# Patient Record
Sex: Male | Born: 1940 | Race: White | Hispanic: No | Marital: Married
Health system: Southern US, Community
[De-identification: ages and names within clinical notes are randomized; demographics above are authoritative.]

---

## 2007-04-22 ENCOUNTER — Inpatient Hospital Stay (HOSPITAL_COMMUNITY): Admission: RE | Admit: 2007-04-22 | Discharge: 2007-04-26 | Payer: Self-pay | Admitting: Orthopedic Surgery

## 2008-01-27 ENCOUNTER — Inpatient Hospital Stay (HOSPITAL_COMMUNITY): Admission: RE | Admit: 2008-01-27 | Discharge: 2008-01-30 | Payer: Self-pay | Admitting: Orthopedic Surgery

## 2010-05-01 IMAGING — CR DG KNEE 1-2V PORT*L*
2 series · 2 of 2 positions shown · non-contrast
Comparison: None

CLINICAL DATA: Postop left knee replacement

PORTABLE LEFT KNEE - 1-2 VIEW

[view not recorded (1 of 2)]
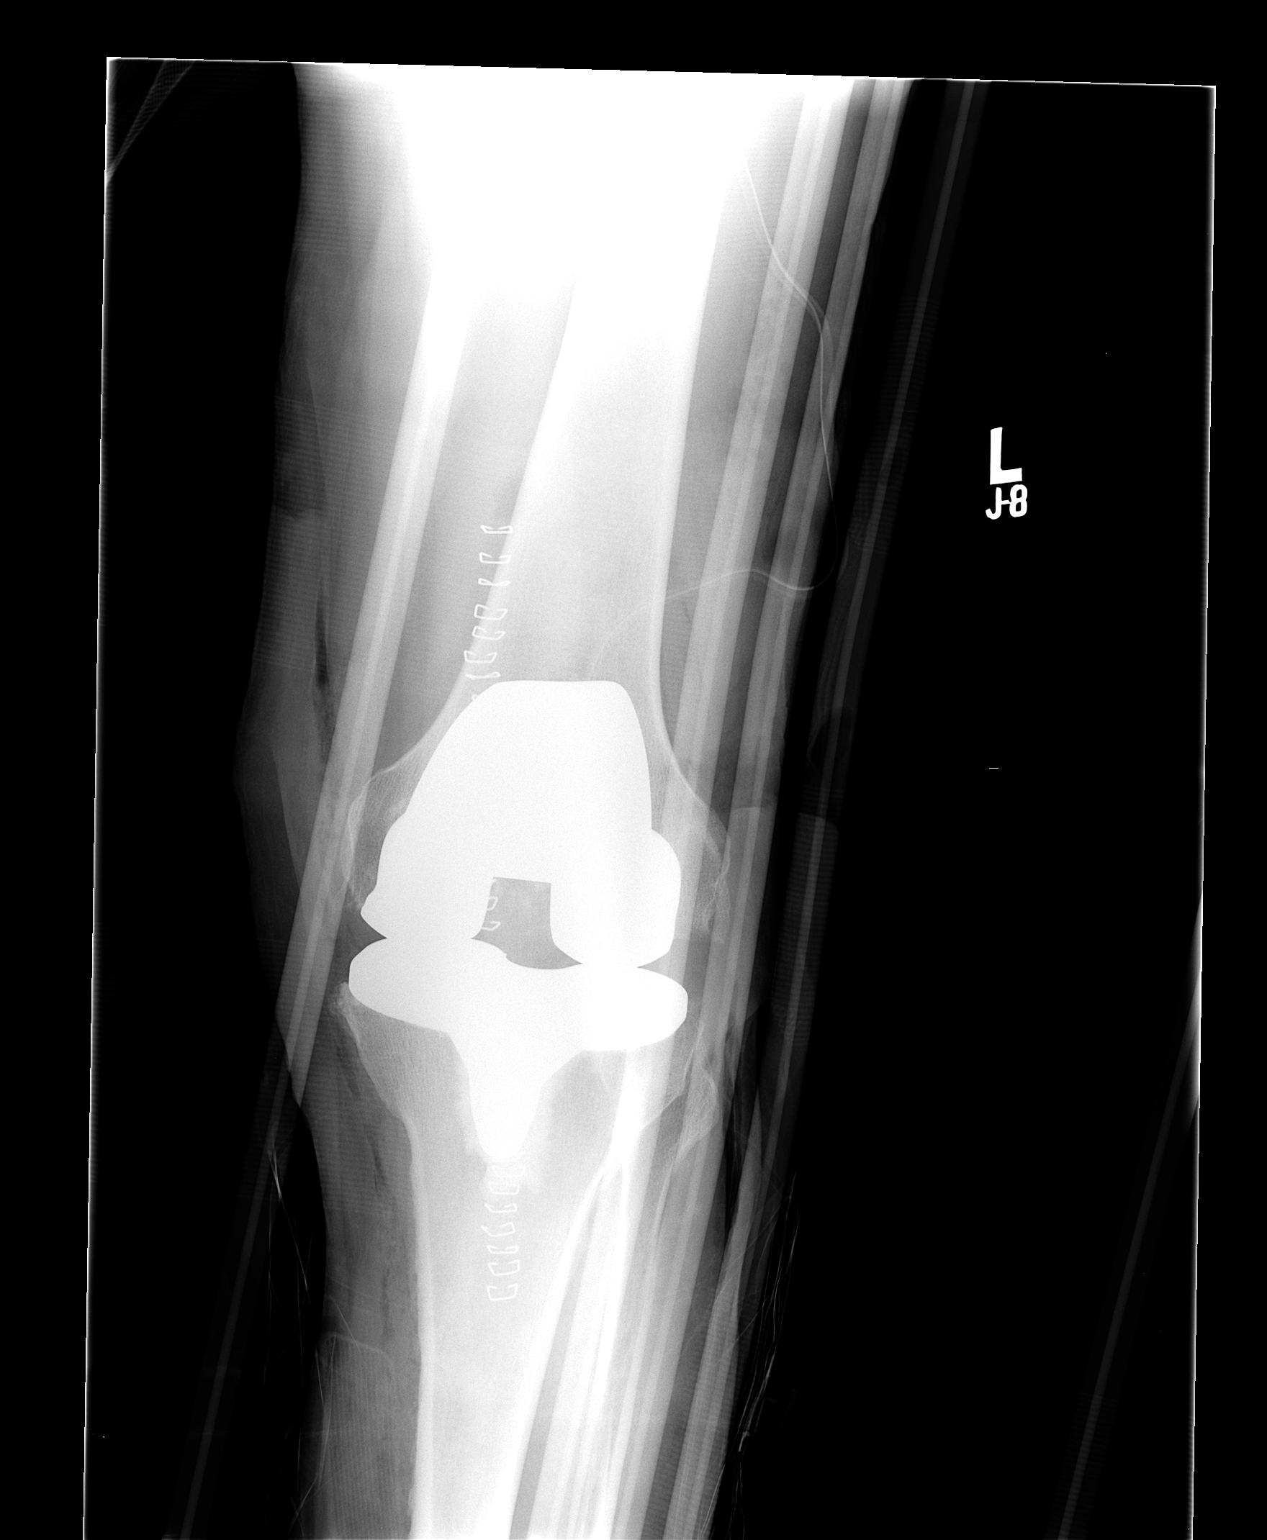

[view not recorded (2 of 2)]
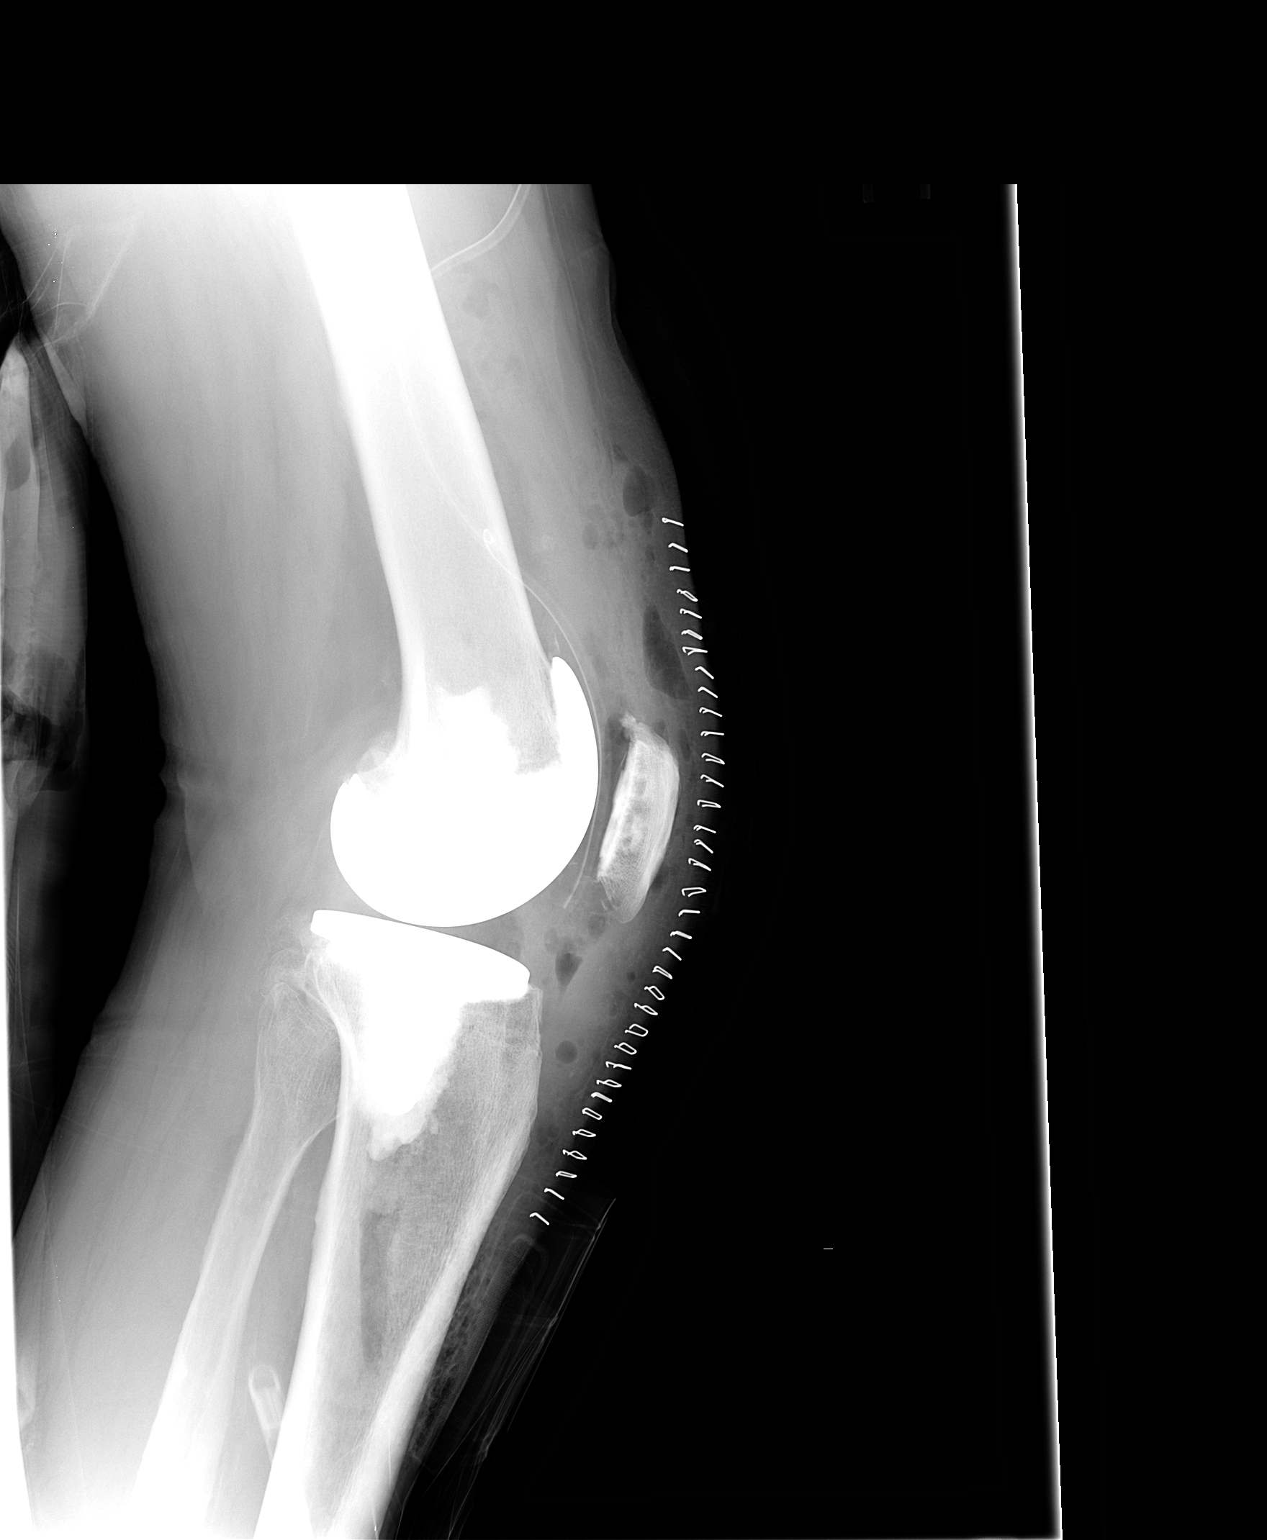

[2 of 2 positions shown; findings below may reference images not displayed]

FINDINGS: Postsurgical changes secondary left total knee
replacement noted.  The prosthesis is well positioned.  The
alignment of the knee is anatomic.  There is no perioperative
complication identified.  A surgical drain extends into the
suprapatellar recess.
IMPRESSION: Normal postoperative appearance left total knee replacement.

## 2010-12-11 NOTE — H&P (Signed)
Dylan Thomas, Dylan Thomas              ACCOUNT NO.:  [id number]   MEDICAL RECORD NO.:  [id number]        PATIENT TYPE:  LINP   LOCATION:                               FACILITY:  [facility]   PHYSICIAN:  Ronald A. Gioffre, M.D.DATE OF BIRTH:  04/30/1941   DATE OF ADMISSION:  01/27/2008  DATE OF DISCHARGE:                              HISTORY & PHYSICAL   CHIEF COMPLAINT:  Painful loss of range of motion of the left knee.   HISTORY OF PRESENT ILLNESS:  Dylan Thomas is a 70 year old gentleman who  has been a patient of Dr. Jeannetta Ellis for a while had bilateral knee pain.  The patient has already had a right total knee arthroplasty with good  results.  His left knee has now gotten to the point where he is having a  hard time ambulating, walking due to severe pain.  X-ray shows that he  is bone-on-bone medial compartment with tricompartment arthritic  changes.  The patient has elected to proceed with a total knee  arthroplasty.   ALLERGIES:  No known drug allergies.   CURRENT MEDICATIONS:  1. Arthrotek 75 mg 1 tablet twice a day.  2. Omeprazole 20 mg a day.  3. Lisinopril 20 mg a day.  4. Potassium citrate 10 mEq once a day.  5. Ativan 0.5 mg p.r.n.   PAST MEDICAL HISTORY:  1. Includes hypertension.  2. History of kidney stones.  3. Anxiety attacks.   REVIEW OF SYSTEMS:  Negative for any neurologic issues other than  occasional anxiety attack.  The Ativan works really well for him.  He  denies any pulmonary.  No cardiovascular.  No GI problems.  His reflux  is well-controlled with the omeprazole.  GU is occasional kidney stones,  last 2 years ago, and he has been placed on potassium citrate for this.  He denies any thyroid or hematologic issues.   PAST SURGICAL HISTORY:  Includes right total knee arthroplasty in  September 2008 without any complications.   FAMILY MEDICAL HISTORY:  Father is deceased from a heart attack at the  age of 26, mother is deceased at the age of 3 from  Crutchfield's  disease.  Brother is deceased at the age of 48 with colorectal cancer.   The patient is married.  He has smoked in the past.  He denies any  alcohol use.  He lives in a 1-storey house.   PHYSICAL EXAM:  VITAL SIGNS:  Height is 6 feet 2 inches, weight is 200  pounds, blood pressure is 120/60, pulse is 70 and regular, respirations  12.  The patient is afebrile.  GENERAL:  This is a healthy-appearing gentleman, conscious, alert, and  appropriate, appears to be a good historian.  He does have a slight bow-  leg deformity on his left side.  He walks with a slight limp.  HEENT:  Head was normocephalic.  Pupils are equal, round, and reactive.  Gross hearing is intact.  NECK:  Supple.  No palpable lymphadenopathy.  Good range of motion.  CHEST:  Lung sounds were clear and equal bilaterally.  No wheeze, rales,  or rhonchi.  HEART:  Regular rate and rhythm.  No murmurs, rubs, or gallops.  ABDOMEN:  Soft, nontender.  Bowel sounds present.  EXTREMITIES:  Upper extremities with good range of motion with  shoulders, elbows, and wrists.  LOWER EXTREMITIES:  He had excellent range of motion of both hips today  without any discomfort.  His right knee, he lacked about 5 degrees of  extension.  He can flex it back to 120 degrees.  He had no gross  instability.  Well-healed midline surgical incision.  The calf was soft.  His left knee, he lacked about 10 degrees of extension.  He was only  able to flex it back to 120 degrees.  He had quite a bit of discomfort  on the medial aspect.  He had no effusion, no erythema.  The calf was  soft.  He had good motion at the ankles.  PERIPHERAL VASCULAR:  Carotid pulses were 2+.  No bruits.  Radial pulses  were 2+.  Posterior tibial pulses were 2+.  He had no lower edema.  NEURO:  The patient was conscious, alert, and appropriate.  He had no  gross neurologic defects noted.  BREAST, RECTAL, AND GU:  Deferred at this time.   IMPRESSION:  1.  End-stage osteoarthritis, left knee.  2. Recent right total knee arthroplasty.  3. Hypertension.  4. Reflux disease.  5. Anxiety attacks.  6. History of kidney stones.   PLAN:  The patient has been evaluated and cleared by his primary care  physician for this upcoming surgical procedure.  The patient will  undergo all routine labs and tests prior to having a left total knee  arthroplasty by Dr. Darrelyn Hillock at [facility] on January 27, 2008.      Jamelle Rushing, P.A.    ______________________________  Georges Lynch Darrelyn Hillock, M.D.    RWK/MEDQ  D:  01/13/2008  T:  01/13/2008  Job:  [id number]   cc:   Windy Fast A. Darrelyn Hillock, M.D.  Fax: [phone]

## 2010-12-11 NOTE — Op Note (Signed)
NAMEHIKEEM, ANDERSSON              ACCOUNT NO.:  [id number]   MEDICAL RECORD NO.:  [id number]          PATIENT TYPE:  INP   LOCATION:  0007                         FACILITY:  [facility]   PHYSICIAN:  Georges Lynch. Gioffre, M.D.DATE OF BIRTH:  August 04, 1940   DATE OF PROCEDURE:  DATE OF DISCHARGE:                               OPERATIVE REPORT   A  70 year old gentleman with a severe degenerative arthritic left knee.  He had a previous right total knee done.  The operation was a lefttotal  knee arthroplasty utilizing the DePuy system.  I utilized vancomycin in  the cement.  The sizes used was a 41-mm patella, a size 5 left posterior  stabilized femoral component, a size 5 tibial tray with a size 4 tibial  insert 10-mm thickness rotating platform.   PROCEDURE:  Under general anesthesia, routine orthopedic prep and  draping of the left lower extremity was carried out.  He had 2 g of IV  Ancef.  The leg was exsanguinated with an Esmarch and the tourniquet was  elevated at 350 mmHg.  At this time, the knee was flexed and an incision  was made over the anterior aspect of the left knee.  Bleeders identified  and cauterized.  At this time, 2 flaps were created in the usual  fashion.  I then did a median parapatellar incision, reflected the  patellar patella laterally, flexed the knee and did a medial and lateral  meniscectomy and I excised the anterior posterior cruciate ligaments.  At that time, he had marked spur formation and I removed all the spurs  from the patella, tibia and the femur.  I then made my initial drill  hole into the intercondylar notch and distal femur.  Following that, the  #1 jig was inserted and removed 12-mm thickness off the distal femur.  Following that, I went ahead and did my sizing guide and measured the  femur to be a size 5.  The next jig was inserted and I carried out  anterior-posterior chamfer cuts for a size 5 femur.  This was the left  femur.  Following that, we  prepared the tibia in the usual fashion.  I  removed  2-mm thickness off the medial side of the tibial plateau and this was  done by utilizing  the intramedullary system.  Following that, I cut my  keel cut of the tibial plateau and we had a nice fit. I thoroughly  irrigated out that and then went ahead and made our notch cut of the  distal femur.  Before inserting the trial components, I utilized my  spacer guide to make sure we had good balance in the ligaments, both  flexion/extension.  Following that, we then inserted our trial  components with a size five 10- mm thickness tibial insert.  The knee  was extended and then I prepared the patella by doing a resurfacing  procedure on the patella.  The appropriate measurements were taken.  We  selected a 41-mm patella.  Three drill holes were made in the articular  surface of the patella.  All 3 components were  removed.  We thoroughly  water picked out the knee and once again made sure there were no  posterior spurs.  I then injected 30 mL of 0.25% Marcaine, epinephrine  and Toradol into the knee soft tissue structures.  I then cemented all 3  components in simultaneously.  Once the cement was hardened, we did  check for cement and removed all loose pieces of cement and I thoroughly  water picked the knee to make sure there were no other loose pieces of  cement present.  The rotating platform was inserted.  We had a nice fit.  The knee was reduced, the Hemovac drain was inserted and we closed the  knee in layers in the usual fashion.  The skin was closed with metal  staples.  A sterile Neosporin dressing was applied.   SURGEON:  Georges Lynch. Darrelyn Hillock, M.D.   ASSISTANT:  Jamelle Rushing, P.A.            ______________________________  Georges Lynch. Darrelyn Hillock, M.D.     RAG/MEDQ  D:  01/27/2008  T:  01/27/2008  Job:  [id number]   cc:   Erasmo Downer, MD   Georges Lynch. Darrelyn Hillock, M.D.  Fax: [phone]

## 2010-12-11 NOTE — Op Note (Signed)
NAMEMASSEY, RUHLAND              ACCOUNT NO.:  [id number]   MEDICAL RECORD NO.:  [id number]          PATIENT TYPE:  INP   LOCATION:  X006                         FACILITY:  [facility]   PHYSICIAN:  Georges Lynch. Gioffre, M.D.DATE OF BIRTH:  [birth year]   DATE OF PROCEDURE:  04/22/2007  DATE OF DISCHARGE:                               OPERATIVE REPORT   SURGEON:  Georges Lynch. Darrelyn Hillock, M.D.   ASSISTANT:  Arlyn Leak PA.   PREOPERATIVE DIAGNOSIS:  Severe degenerative arthritis with a severe  genu varus deformity of the right knee.   POSTOPERATIVE DIAGNOSIS:  Severe degenerative arthritis with a severe  genu varus deformity of the right knee.   OPERATION:  Right total knee arthroplasty utilizing the DePuy system.  I  utilized a rotating platform type insert and all three components were  cemented and vancomycin was used in the cement.  The sizes used:  I used  the femoral component was a size 5 right posterior cruciate sacrificing  type.  The tibial tray was a size 5.  Tibial insert was a size 5, 15 mm  thickness rotating platform insert.  The patella was a size 41.  Note  all three components were cemented.  I also utilized a four-pronged  Mitek anchor for the patella.   PROCEDURE:  Under general anesthesia, routine orthopedic prep and drape  of the right lower extremity carried out.  The patient 1 gram of IV  Ancef.  Leg was exsanguinated with Esmarch and tourniquet was elevated  to 350 mmHg.  At this time the knee was flexed.  An incision was made  over the anterior aspect of the right knee.  This time two flaps were  created.  I then carried out a median parapatellar incision reflecting  patella laterally, flexed the knee and did medial lateral meniscectomies  and excised the anterior posterior cruciate ligaments.  Following that I  went ahead and made my initial drill hole in the intercondylar notch.  The guide rod was inserted in the femoral canal and we removed 12 mm  thickness off the  distal femur because of its contracture.  Following  that, the #2 jig was inserted over the femur for measurement purposes.  We measured the size 5 femur.  Third jig then was inserted.  We carried  out our anterior posterior chamfering cuts for a size 5 prosthesis.  Following that we prepared the tibia, made our initial drill hole in the  tibial plateau.  After we took the appropriate measurements for the  tibial tray to be a size 5.  We then cut our tibia.  We removed  approximately 4 mm thickness off the affected medial side of tibia.  We  then inserted the spacer blocks to make sure we had good tension in  flexion and extension.  Following that we then completed the preparation  of the tibia by cutting our keel cut out of the tibia.  We then made our  notch cut out of the femur in usual fashion.  We then inserted our trial  components and had an excellent fit.  Following  that we then carried out  our resurfacing procedure of the patella in the usual fashion.  We  measured the patella be a size 41.  Three drill holes were made in the  articular surface of patella in the usual fashion and the trial patella  was inserted.  We had a good fit.  We then removed all three components,  trial components, thoroughly water picked out the knee, dried the knee  out, cemented all three components in simultaneously.  We then removed  all loose pieces of cement.  We went through and inserted our temporary  15 mm thickness polyethylene insert and following that, we had checked  our flexion/extension lateral medial stability.  We had excellent  stability, excellent flexion/extension.  We then removed the trial  insert, water picked out the knee and looked for loose pieces of cement.  We then inserted our permanent prosthesis, permanent insert which was a  rotating platform size 5, 15 mm thickness.  Once all the permanent  components were cemented in place, we searched for cement once again,  thoroughly  irrigated out the  knee.  We injected the knee with 30 mL of 0.25% Marcaine with Toradol  and epinephrine and following that, the tourniquet was let down.  We  looked for bleeders.  We then inserted the Hemovac drain, closed the  knee in layers, usual fashion.  Sterile dressings were applied.           ______________________________  Georges Lynch Darrelyn Hillock, M.D.     RAG/MEDQ  D:  04/22/2007  T:  04/23/2007  Job:  [id number]

## 2010-12-11 NOTE — H&P (Signed)
Dylan Thomas              ACCOUNT NO.:  [id number]   MEDICAL RECORD NO.:  [id number]          PATIENT TYPE:  INP   LOCATION:  NA                           FACILITY:  [facility]   PHYSICIAN:  Dylan Thomas, M.D.DATE OF BIRTH:  1941-05-06   DATE OF ADMISSION:  04/22/2007  DATE OF DISCHARGE:                              HISTORY & PHYSICAL   CHIEF COMPLAINT:  Severe bilateral knee pain with loss of range of  motion.   HISTORY OF PRESENT ILLNESS:  [name] is a 70 year old gentleman with  a longstanding history of chronic bilateral knee pain with progressively  worsening and severe loss of range of motion.  The patient has been  evaluated, he is shown to have severe osteoarthritis bilateral knees  with bone on bone medial compartments bilaterally with severe  patellofemoral changes.  The patient has elected to proceed with a right  total knee arthroplasty as he feels currently this one is the one that  is bothering him the most.   ALLERGIES:  No known drug allergies.   CURRENT MEDICATIONS:  1. Arthrotec 75 mg 1 tablet twice a day.  2. Potassium citrate 1080 mg 4 times a day.  3. Omeprazole 20 mg once a day.  4. Osteo Bi-Flex 2 tablets a day.  5. Calcium, magnesium and zinc.   MEDICAL HISTORY:  1. Severe osteoarthritis bilateral knees.  2. History of reflux disease.  3. Hemorrhoids.  4. History of cataracts.  5. 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      Father-in-law had tuberculosis.  6. History of kidney stones.  7. History of hepatitis as a child with no chronic illness.  8. History of kidney stones with lithotripsy.   REVIEW OF SYSTEMS:  Negative for any neurologic issues other than  cataract.  No previous strokes, seizures or avulsions.  Denies any  pulmonary issues.  Denies any cardiac issues.  He does occasionally have  some blood in the stool related to his hemorrhoids. His reflux disease  is well controlled.  No other issues.   GU is unremarkable other than  recent kidney stones in 2006. Denies any endocrine, hematologic issues.   PAST SURGICAL HISTORY:  Lithotripsy in 2005 and 2006 without any  complications.   FAMILY HISTORY:  His father is deceased at 3 years of age from an MRI  father.  Mother is deceased from complications from Crutchfield's  disease.   SOCIAL HISTORY:  The patient is married, works in the [employer].  He uses about 1 can of snuff, does not smoke.  He stopped using alcohol  many years previous, lives in a house one-story with 4 steps to the main  entrance. His wife is going to care for him postop.   PRIMARY CARE PHYSICIAN:  Dr. Mikey Thomas in [place].   PHYSICAL EXAM:  VITALS:  Height is 6 feet, weight is 190 pounds, blood  pressure was 140/78, pulse of 70 and regular, respirations 12, patient  is afebrile.  GENERAL:  This is a healthy-appearing gentleman conscious, alert and  appropriate, appears to be a good historian.  Ambulates with  significant bilateral bow-legged deformities and limps.  HEENT:  Head was normocephalic.  Pupils equal and active. Dentition was  in fair repair.  He had erosive changes about the lower teeth.  Grossly  hearing is intact.  Vision is intact.  NECK:  Supple.  No palpable lymphadenopathy.  Thyroid region was  nontender.  CHEST:  Lung sounds were clear and equal bilaterally.  No wheezes,  rales, rhonchi.  HEART:  Regular rate and rhythm.  No murmurs, rubs or gallops.  ABDOMEN:  Soft, nontender.  Bowel sounds present.  No CVA region  tenderness.  EXTREMITIES:  Upper extremities were symmetric size, shape.  He had good  range of motion in the shoulders, elbows and wrists. Motor strength is  5/5.  Lower Extremities, bilateral hips had good range of motion without any  difficulty discomfort.  Both knees were round, boggy appearing. He had  varus deformities of both knees.  He had severe crepitus in both knees,  right knee he was barely able to extend  it about 10 degrees short of  full extension and flex it back to about 80 degrees.  He had obvious  mechanical catching and popping.  The left knee he was able to extend  about 10 degrees full extension, flex it back to about 100 degrees.  He  did have laxity with varus varus valgus and varus stress but no gross  instability. With AP and Lachman's testing calves were nontender.  Ankles were symmetrical with good dorsi plantar flexion.  PERIPHERAL VASCULAR:  Carotid pulses were 2+, no bruits.  Radial pulses  2+, dorsalis pedis pulses were 1+.  He did have some varicosities in the  lower extremity.  No venous stasis changes or edema.  NEURO:  The patient was conscious, alert and appropriate, grossly  neurologically intact.  BREAST, RECTAL AND GU:  Exams were deferred at this time.   IMPRESSION:  1. Severe osteoarthritis bilateral knees, bone on bone medial      compartments with varus deformities, right more symptomatic than      left.  2. Cataracts.  3. 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 Father-      in-law had tuberculosis.  4. Reflux disease.  5. Hemorrhoids with occasional blood in stool.  6. History of hepatitis at the age of 70 without chronic disease.  7. History of kidney stones, the last being 2006.   PLAN:  The patient has been evaluated by Dr. Linna Thomas who referred him to  our office for evaluation for his total knee arthroplasties. He is  currently cleared for the upcoming surgical procedure.  The patient will  undergo all routine labs and tests prior to having a right total knee  arthroplasty by Dr. Darrelyn Thomas at [facility] on April 22, 2007.     Dylan Thomas, P.A.    ______________________________  Dylan Thomas, M.D.   RWK/MEDQ  D:  04/10/2007  T:  04/11/2007  Job:  [id number]

## 2010-12-14 NOTE — Discharge Summary (Signed)
Dylan Thomas, MERAS              ACCOUNT NO.:  [id number]   MEDICAL RECORD NO.:  [id number]          PATIENT TYPE:  INP   LOCATION:  1616                         FACILITY:  [facility], [place]   PHYSICIAN:  Georges Lynch. Gioffre, M.D.DATE OF BIRTH:  1940/08/21   DATE OF ADMISSION:  04/22/2007  DATE OF DISCHARGE:  04/26/2007                               DISCHARGE SUMMARY   ADMISSION DIAGNOSIS:  1. Severe osteoarthritis bilateral knees, bone-on-bone medial      compartment, right more symptomatic than left.  2. History of cataracts.  3. 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  4. Reflux disease.  5. History of hemorrhoids.  6. History of hepatitis.  7. History of kidney stones.   DISCHARGE DIAGNOSIS:  1. Right total knee arthroplasty.  2. Postoperative blood loss anemia, corrected with 2 units of packed      red blood cells, tolerated well.  3. History of cataracts.  4. 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  5. History of reflux disease.  6. History of hemorrhoids.  7. History of hepatitis.  8. History of kidney stones.   HISTORY OF PRESENT ILLNESS:  The patient is a 70 year old gentleman with  longstanding history of bilateral knee pain, progressively worsening  over time with loss of range of motion, and pain with weightbearing.  The patient has elected to proceed with a total knee arthroplasty.  The  patient has bone-on-bone medial compartment bilateral knees.   ALLERGIES:  No known drug allergies.   CURRENT MEDICATIONS:  1. Arthrotec 75 mg 1 tablet twice a day.  2. Potassium citrate 1080 mg four times a day.  3. Omeprazole 20 mg a day.  4. Osteo Bi-Flex 2 tablets a day.  5. Calcium.  6. Magnesium.  7. Zinc.   SURGICAL PROCEDURE:  On April 22, 2007, the patient was taken to the  OR by Dr. Ranee Gosselin assisted by Jamelle Rushing, P.A.-C.  Under  general anesthesia the patient underwent a right total knee arthroplasty  with a DePuy  rotating platform system.  The patient tolerated the  procedure well, there were no complications.  There was minimal blood  loss.  The patient had the following components implanted:  A size 5  right femoral component, a size 5 keel tibial tray, a size five 15-mm  polyethylene bearing, a size 41 three-peg patella.  All components were  implanted with polymethyl methacrylate with vancomycin mixed in.  Dr.  Darrelyn Hillock did use 1 Mitek anchor to help resecure the patella tendon.  The  patient tolerated the procedure well, and was transferred to the  recovery room in good condition for routine total knee protocol.   CONSULTS:  The following routine consults requested:  Physical therapy,  occupational therapy, case management, and pharmacy for Coumadin dosing.   HOSPITAL COURSE:  On April 22, 2007, the patient was admitted to  [facility] under the care of Dr. Ranee Gosselin.  The patient  was taken to the OR where a right total knee arthroplasty was performed  without any complications.  The patient was transferred to the recovery  room  and then to orthopedic floor with IV pain medicines, antibiotics,  and DVT prophylaxis for a routine total knee protocol.  Postoperatively  he did develop some postoperative blood loss anemia with his hemoglobin  dropping to 8.1.  He was typed-and-crossed, and received 2 units of  packed red blood cells without any complications.  With his hemoglobin  initially improving to 8.9.  The patient's vital signs otherwise  remained stable.  The patient remained afebrile.  The patient's wound  remained benign for any signs of infection.  The patient was able to  transition from IV medications to p.o. meds well.  The patient worked  well with physical therapy, and on postop day #4 the patient was felt to  be orthopedically and medically stable, and ready for discharge home.  So, he was discharged in good condition with outpatient physical therapy   arrangements made.   LABS:  CBC on admission found WBC to be 5.5, hemoglobin 14.5 hematocrit  41.6, platelets 225.  On September 27 his H&H dropped to 8.1 and 22.5.  He was type-and-crossed, and transfused 2 units of packed red blood  cells with improvement to 8.9/25.4.  The patient's INR on date of  discharge was 1.7 on Coumadin.  Routine chemistries on admission were  all within normal limits with an estimated GFR of greater than 60.  Routine urinalysis on admission was normal.  The patient received a  total of 2 units of packed red blood cells during his hospitalization.  EKG was normal sinus rhythm on preadmission.   DISCHARGE INSTRUCTIONS:  1. Diet--no restrictions.  2. Activity--the patient is to work with physical therapy for his      routine total knee protocol.  3. Wound care--the patient should change the dressing daily.   DISCHARGE MEDICATIONS:  1. Arthrotec 75 mg 1 tablet twice a day.  2. Omeprazole 20 mg 1 tablet every day.  3. Potassium 10 mEq twice a day.  4. Osteo Bi-Flex 2 tablets a day.  5. Calcium.  6. Magnesium.  7. Zinc on a daily basis.  8. Percocet 10/650 one tablet q.6 h. for pain if needed.  9. Robaxin 500 mg p.o. q.6 h. for pain if needed.  10.Coumadin 5 mg once a day unless changed by pharmacy.   FOLLOW UP:  The patient needs a followup appointment with Dr. Jeannetta Ellis  office 2 weeks from surgery.   DISPOSITION:  The patient's condition upon discharge to home is listed  as good.      Jamelle Rushing, P.A.    ______________________________  Georges Lynch Darrelyn Hillock, M.D.    RWK/MEDQ  D:  05/13/2007  T:  05/13/2007  Job:  [id number]   cc:   Windy Fast A. Darrelyn Hillock, M.D.  Fax: [phone]

## 2010-12-14 NOTE — Discharge Summary (Signed)
Dylan Thomas, Dylan Thomas              ACCOUNT NO.:  [id number]   MEDICAL RECORD NO.:  [id number]          PATIENT TYPE:  INP   LOCATION:  1615                         [facility]ACILITY:  [facility]   PHYSICIAN:  Georges Lynch. Gioffre, M.D.DATE OF BIRTH:  [birth year]   DATE OF ADMISSION:  01/27/2008  DATE OF DISCHARGE:  01/30/2008                               DISCHARGE SUMMARY   ADMISSION DIAGNOSES:  1. End-stage osteoarthritis left knee.  2. Recent total right knee arthroplasty.  3. Hypertension.  4. Reflux disease.  5. Anxiety attacks.  6. History of kidney stones.   DISCHARGE DIAGNOSES:  1. Left total knee arthroplasty.  2. Asymptomatic postoperative blood loss anemia corrected with p.o.      supplements.  3. Hypertension.  4. Reflux disease.  5. History of anxiety attacks.  6. History kidney stones.   HISTORY OF PRESENT ILLNESS:  The patient is 70 year old gentleman well-  known patient to Dr. Darrelyn Hillock with bilateral knee pain.  The patient had  end-stage osteoarthritis bilateral knees.  The patient had a right total  knee arthroplasty previously and has elected to proceed with a left knee  arthroplasty.  X-ray shows this is bone on bone with tricompartmental  changes.   ALLERGIES:  No known drug allergies.   CURRENT MEDICATIONS:  1. Arthrotec 75 mg 1 tablet twice a day.  2. Omeprazole 20 mg a day.  3. Lisinopril 20 mg a day.  4. Potassium citrate 10 mEq p.o. a day.  5. Ativan 0.5 mg p.r.n.   SURGICAL PROCEDURES:  On January 27, 2008, the patient was taken to the OR  by Dr. Ranee Gosselin assisted by Oneida Alar PA-C.  Under general  anesthesia the patient underwent a left total knee arthroplasty with a  DePuy rotating platform system.  The patient tolerated the procedure  well.  There were no complications.  Minimal blood lost.  The patient  was transferred to the recovery room then to the orthopedic floor to  follow a total knee protocol.  The patient had the following components  implanted:  Size 5 left femoral segment component, a size 5 keel tibial  tray, size 5 polyethylene bearing, 10-mm thickness and a size 41 mm 3-  peg patellar.  All components were implanted with polymethyl  methacrylate with vancomycin.   CONSULTS:  Following routine consults requested:  Physical therapy, case  management, pharmacy.   HOSPITAL COURSE:  On January 27, 2008, the patient was admitted to [facility] under the care of Dr. Ranee Gosselin.  The patient was  taken to the OR where a left total knee arthroplasty was performed  without any complications.  The patient tolerated the procedure well was  transferred to recovery room and then to orthopedic floor in good  condition with IV pain medicines, antibiotics and DVT prophylaxis to  follow the total knee protocol.  The patient then incurred a total of 3  days postoperative on the orthopedic floor without any significant  untoward events.  The patient did develop some slight postoperative  blood loss anemia.  His hemoglobin dropped to 9.6 with a hematocrit  of  27.6.  The patient's vital signs were stable.  The patient denied any  symptoms with physical therapy, so was allowed to self-correct with p.o.  supplements.  The patient's wound remained benign for any signs  infection.  His leg remained neuromotor vascularly intact.  The patient  was able to transition off of IV medicines to p.o. meds well.  It was  felt on postop day #3 the patient was orthopedically and medically  stable, ready for discharge home.  Arrangements were made for outpatient  home health physical therapy for protocol and the patient was discharged  with routine followup.   LABS:  CBC on admission found WBCs 5.0, hemoglobin 13.9, hematocrit  40.9, platelets 184.  The patient's hemoglobin did drop to 8.7 over 24.6  on date of discharge but the patient was asymptomatic, so was allowed to  self-correct with p.o. supplements.  The patient's INR on date of   discharge was 1.8.  Routine chemistries were on admission within normal  limits except for glucose 113 and an estimated GFR of 59.  Routine  urinalysis on admission was within normal limits.   DISCHARGE INSTRUCTIONS:  Diet no restrictions.   ACTIVITY:  The patient is to follow the total knee protocol with  physical therapist, ambulate with the use of a walker.   WOUND CARE:  The patient is to change his dressing on a daily basis,  will check it for infection daily.   FOLLOWUP:  The patient needs a followup appointment with Dr. Darrelyn Hillock in  2 weeks.  The patient is to call [phone] for that followup appointment.   MEDICATIONS UPON DISCHARGE:  1. Coumadin 5 mg tablets 1-1/2 tablet a day unless changed by the      pharmacist.  2. Robaxin 500 mg 1 tablet every 6 hours for muscle spasms if needed.  3. Percocet 10/650 1 tablet every 4-6 hours for pain if needed.  4. Alprazolam 0.5 mg 1 tablet twice a day as needed.  5. Omeprazole 20 mg once a day.  6. Lisinopril 20 mg once a day.  7. Arthrotec to be placed on hold until done with Coumadin.  8. Aspirin to be placed on hold until done with Coumadin.  9. Magnesium with zinc 2 tablets daily.  10.Potassium citrate ER 10 mEq 1 tablet twice a day.   The patient's condition upon discharge to home is listed improved and  good.      Jamelle Rushing, P.A.    ______________________________  Georges Lynch Darrelyn Hillock, M.D.    RWK/MEDQ  D:  02/18/2008  T:  02/18/2008  Job:  [id number]   cc:   Windy Fast A. Darrelyn Hillock, M.D.  Fax: [phone]

## 2011-04-25 LAB — CBC
HCT: 40.9
Hemoglobin: 13.9
MCHC: 34.1
Platelets: 184
RDW: 13.6

## 2011-04-25 LAB — HEMOGLOBIN AND HEMATOCRIT, BLOOD
HCT: 27.6 — ABNORMAL LOW
Hemoglobin: 10.2 — ABNORMAL LOW
Hemoglobin: 9.6 — ABNORMAL LOW

## 2011-04-25 LAB — PROTIME-INR
INR: 0.9
INR: 1.1
INR: 1.4
INR: 1.8 — ABNORMAL HIGH

## 2011-04-25 LAB — TYPE AND SCREEN
ABO/RH(D): A POS
Antibody Screen: NEGATIVE

## 2011-04-25 LAB — DIFFERENTIAL
Lymphocytes Relative: 26
Lymphs Abs: 1.3
Monocytes Absolute: 0.4
Monocytes Relative: 8
Neutro Abs: 3.1
Neutrophils Relative %: 62

## 2011-04-25 LAB — URINALYSIS, ROUTINE W REFLEX MICROSCOPIC
Bilirubin Urine: NEGATIVE
Nitrite: NEGATIVE
Specific Gravity, Urine: 1.014
Urobilinogen, UA: 1

## 2011-04-25 LAB — COMPREHENSIVE METABOLIC PANEL
Albumin: 4.1
BUN: 21
Calcium: 10
Glucose, Bld: 113 — ABNORMAL HIGH
Sodium: 139
Total Protein: 7.3

## 2011-04-25 LAB — APTT: aPTT: 29

## 2011-05-09 LAB — TYPE AND SCREEN

## 2011-05-09 LAB — COMPREHENSIVE METABOLIC PANEL
Alkaline Phosphatase: 99
BUN: 11
Chloride: 100
Glucose, Bld: 90
Potassium: 4.1
Total Bilirubin: 1
Total Protein: 8

## 2011-05-09 LAB — PROTIME-INR
INR: 0.9
INR: 1.1
Prothrombin Time: 12.6
Prothrombin Time: 14.5
Prothrombin Time: 20.2 — ABNORMAL HIGH

## 2011-05-09 LAB — CROSSMATCH
ABO/RH(D): A POS
Antibody Screen: NEGATIVE

## 2011-05-09 LAB — HEMOGLOBIN AND HEMATOCRIT, BLOOD
HCT: 25.4 — ABNORMAL LOW
HCT: 25.7 — ABNORMAL LOW
HCT: 29.1 — ABNORMAL LOW
Hemoglobin: 9.1 — ABNORMAL LOW

## 2011-05-09 LAB — DIFFERENTIAL
Basophils Absolute: 0
Basophils Relative: 1
Neutro Abs: 3.4
Neutrophils Relative %: 62

## 2011-05-09 LAB — URINALYSIS, ROUTINE W REFLEX MICROSCOPIC
Ketones, ur: NEGATIVE
Nitrite: NEGATIVE
Protein, ur: NEGATIVE

## 2011-05-09 LAB — CBC
HCT: 41.6
Hemoglobin: 14.5
RDW: 13.1

## 2014-01-26 DEATH — deceased

## 2014-02-09 ENCOUNTER — Telehealth: Payer: Self-pay

## 2014-02-09 NOTE — Telephone Encounter (Signed)
Patient died @ [facility] per Obituary °
